# Patient Record
Sex: Female | Born: 1990 | Race: Black or African American | Hispanic: No | Marital: Single | Smoking: Never smoker
Health system: Southern US, Community
[De-identification: ages and names within clinical notes are randomized; demographics above are authoritative.]

## PROBLEM LIST (undated history)

## (undated) ENCOUNTER — Inpatient Hospital Stay (HOSPITAL_COMMUNITY): Payer: Self-pay

## (undated) HISTORY — PX: DILATION AND CURETTAGE OF UTERUS: SHX78

---

## 2015-01-01 ENCOUNTER — Encounter (HOSPITAL_COMMUNITY): Payer: Self-pay

## 2015-01-01 ENCOUNTER — Inpatient Hospital Stay (HOSPITAL_COMMUNITY)
Admission: AD | Admit: 2015-01-01 | Discharge: 2015-01-01 | Disposition: A | Discharge status: Home / Self Care | Payer: 59 | Source: Ambulatory Visit | Attending: Obstetrics & Gynecology | Admitting: Obstetrics & Gynecology

## 2015-01-01 DIAGNOSIS — M545 Low back pain: Secondary | ICD-10-CM | POA: Diagnosis present

## 2015-01-01 DIAGNOSIS — O9989 Other specified diseases and conditions complicating pregnancy, childbirth and the puerperium: Secondary | ICD-10-CM | POA: Insufficient documentation

## 2015-01-01 DIAGNOSIS — M549 Dorsalgia, unspecified: Secondary | ICD-10-CM

## 2015-01-01 DIAGNOSIS — Z3A16 16 weeks gestation of pregnancy: Secondary | ICD-10-CM | POA: Diagnosis not present

## 2015-01-01 DIAGNOSIS — O99891 Other specified diseases and conditions complicating pregnancy: Secondary | ICD-10-CM

## 2015-01-01 DIAGNOSIS — O26892 Other specified pregnancy related conditions, second trimester: Secondary | ICD-10-CM

## 2015-01-01 LAB — URINALYSIS, ROUTINE W REFLEX MICROSCOPIC
Bilirubin Urine: NEGATIVE
Glucose, UA: NEGATIVE mg/dL
Hgb urine dipstick: NEGATIVE
Ketones, ur: NEGATIVE mg/dL
Leukocytes, UA: NEGATIVE
Nitrite: NEGATIVE
Protein, ur: NEGATIVE mg/dL
Specific Gravity, Urine: 1.025 (ref 1.005–1.030)
Urobilinogen, UA: 0.2 mg/dL (ref 0.0–1.0)
pH: 7 (ref 5.0–8.0)

## 2015-01-01 NOTE — MAU Provider Note (Signed)
  History     CSN: [id number]  Arrival date and time: 01/01/15 1518   None     Chief Complaint  Patient presents with  . Back Pain   HPI This is a 24 y.o. female at 941w6d who presents with c/o low back pain and dysuria. Denies bleeding or contractions.   Has not had any care with this pregnancy yet   Has a 816 month old. States is afraid to tell her mother.  Went to [place] with that baby. 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 Left sided flank pain. No bleeding or abnormal vaginal discharge. LMP 09/05/2014. +HPT in December.           OB History    Gravida Para Term Preterm AB TAB SAB Ectopic Multiple Living   3 1  1 1 1    1       No past medical history on file.  No past surgical history on file.  No family history on file.  History  Substance Use Topics  . Smoking status: Not on file  . Smokeless tobacco: Not on file  . Alcohol Use: Not on file    Allergies: No Known Allergies  No prescriptions prior to admission    Review of Systems  Constitutional: Negative for fever, chills and malaise/fatigue.  Gastrointestinal: Negative for nausea, vomiting, abdominal pain, diarrhea and constipation.  Genitourinary: Positive for dysuria (yesterday).  Musculoskeletal: Positive for back pain.  Neurological: Negative for dizziness.   Physical Exam   Last menstrual period 09/05/2014.  Physical Exam  Constitutional: She is oriented to person, place, and time. She appears well-developed and well-nourished. No distress.  HENT:  Head: Normocephalic.  Cardiovascular: Normal rate.   Respiratory: Effort normal.  Genitourinary: Vagina normal. No vaginal discharge found.  Cervix long and closed Pt declines STD tests  Musculoskeletal: Normal range of motion.  Neurological: She is alert and oriented to person, place, and time.  Skin: Skin is warm and dry.  Psychiatric:  She has a normal mood and affect.   FHR 154  MAU Course  Procedures  MDM Results for orders placed or performed during the hospital encounter of 01/01/15 (from the past 24 hour(s))  Urinalysis, Routine w reflex microscopic     Status: None   Collection Time: 01/01/15  4:10 PM  Result Value Ref Range   Color, Urine YELLOW YELLOW   APPearance CLEAR CLEAR   Specific Gravity, Urine 1.025 1.005 - 1.030   pH 7.0 5.0 - 8.0   Glucose, UA NEGATIVE NEGATIVE mg/dL   Hgb urine dipstick NEGATIVE NEGATIVE   Bilirubin Urine NEGATIVE NEGATIVE   Ketones, ur NEGATIVE NEGATIVE mg/dL   Protein, ur NEGATIVE NEGATIVE mg/dL   Urobilinogen, UA 0.2 0.0 - 1.0 mg/dL   Nitrite NEGATIVE NEGATIVE   Leukocytes, UA NEGATIVE NEGATIVE     Assessment and Plan  A:  SIUP at 6441w6d        Low back pain  P:  Discussed back pain       Advised to stop lifting until back pain improves, then no more than 25  Lbs       Proof of pregnancy letter provided       [facility]  Lonn Im 01/01/2015, 4:12 PM

## 2015-01-01 NOTE — MAU Note (Signed)
Pt states pain with voiding yesterday. Left sided flank pain. No bleeding or abnormal vaginal discharge. LMP 09/05/2014. +HPT in December.

## 2015-01-01 NOTE — Discharge Instructions (Signed)
Back Injury Prevention Back injuries can be extremely painful and difficult to heal. After having one back injury, you are much more likely to experience another later on. It is important to learn how to avoid injuring or re-injuring your back. The following tips can help you to prevent a back injury. PHYSICAL FITNESS  Exercise regularly and try to develop good tone in your abdominal muscles. Your abdominal muscles provide a lot of the support needed by your back.  Do aerobic exercises (walking, jogging, biking, swimming) regularly.  Do exercises that increase balance and strength (tai chi, yoga) regularly. This can decrease your risk of falling and injuring your back.  Stretch before and after exercising.  Maintain a healthy weight. The more you weigh, the more stress is placed on your back. For every pound of weight, 10 times that amount of pressure is placed on the back. DIET  Talk to your caregiver about how much calcium and vitamin D you need per day. These nutrients help to prevent weakening of the bones (osteoporosis). Osteoporosis can cause broken (fractured) bones that lead to back pain.  Include good sources of calcium in your diet, such as dairy products, green, leafy vegetables, and products with calcium added (fortified).  Include good sources of vitamin D in your diet, such as milk and foods that are fortified with vitamin D.  Consider taking a nutritional supplement or a multivitamin if needed.  Stop smoking if you smoke. POSTURE  Sit and stand up straight. Avoid leaning forward when you sit or hunching over when you stand.  Choose chairs with good low back (lumbar) support.  If you work at a desk, sit close to your work so you do not need to lean over. Keep your chin tucked in. Keep your neck drawn back and elbows bent at a right angle. Your arms should look like the letter "L."  Sit high and close to the steering wheel when you drive. Add a lumbar support to your car  seat if needed.  Avoid sitting or standing in one position for too long. Take breaks to get up, stretch, and walk around at least once every hour. Take breaks if you are driving for long periods of time.  Sleep on your side with your knees slightly bent, or sleep on your back with a pillow under your knees. Do not sleep on your stomach. LIFTING, TWISTING, AND REACHING  Avoid heavy lifting, especially repetitive lifting. If you must do heavy lifting:  Stretch before lifting.  Work slowly.  Rest between lifts.  Use carts and dollies to move objects when possible.  Make several small trips instead of carrying 1 heavy load.  Ask for help when you need it.  Ask for help when moving big, awkward objects.  Follow these steps when lifting:  Stand with your feet shoulder-width apart.  Get as close to the object as you can. Do not try to pick up heavy objects that are far from your body.  Use handles or lifting straps if they are available.  Bend at your knees. Squat down, but keep your heels off the floor.  Keep your shoulders pulled back, your chin tucked in, and your back straight.  Lift the object slowly, tightening the muscles in your legs, abdomen, and buttocks. Keep the object as close to the center of your body as possible.  When you put a load down, use these same guidelines in reverse.  Do not:  Lift the object above your waist.  Twist at the waist while lifting or carrying a load. Move your feet if you need to turn, not your waist. °¨ Bend over without bending at your knees. °· Avoid reaching over your head, across a table, or for an object on a high surface. °OTHER TIPS °· Avoid wet floors and keep sidewalks clear of ice to prevent falls. °· Do not sleep on a mattress that is too soft or too hard. °· Keep items that are used frequently within easy reach. °· Put heavier objects on shelves at waist level and lighter objects on lower or higher shelves. °· Find ways to  decrease your stress, such as exercise, massage, or relaxation techniques. Stress can build up in your muscles. Tense muscles are more vulnerable to injury. °· Seek treatment for depression or anxiety if needed. These conditions can increase your risk of developing back pain. °SEEK MEDICAL CARE IF: °· You injure your back. °· You have questions about diet, exercise, or other ways to prevent back injuries. °MAKE SURE YOU: °· Understand these instructions. °· Will watch your condition. °· Will get help right away if you are not doing well or get worse. °Document Released: 11/28/2004 Document Revised: 01/13/2012 Document Reviewed: 12/02/2011 °ExitCare® Patient Information ©2015 [employer]. This information is not intended to replace advice given to you by your health care provider. Make sure you discuss any questions you have with your health care provider. ° °Second Trimester of Pregnancy °The second trimester is from week 13 through week 28, months 4 through 6. The second trimester is often a time when you feel your best. Your body has also adjusted to being pregnant, and you begin to feel better physically. Usually, morning sickness has lessened or quit completely, you may have more energy, and you may have an increase in appetite. The second trimester is also a time when the fetus is growing rapidly. At the end of the sixth month, the fetus is about 9 inches long and weighs about 1½ pounds. You will likely begin to feel the baby move (quickening) between 18 and 20 weeks of the pregnancy. °BODY CHANGES °Your body goes through many changes during pregnancy. The changes vary from woman to woman.  °· Your weight will continue to increase. You will notice your lower abdomen bulging out. °· You may begin to get stretch marks on your hips, abdomen, and breasts. °· You may develop headaches that can be relieved by medicines approved by your health care provider. °· You may urinate more often because the fetus is  pressing on your bladder. °· You may develop or continue to have heartburn as a result of your pregnancy. °· You may develop constipation because certain hormones are causing the muscles that push waste through your intestines to slow down. °· You may develop hemorrhoids or swollen, bulging veins (varicose veins). °· You may have back pain because of the weight gain and pregnancy hormones relaxing your joints between the bones in your pelvis and as a result of a shift in weight and the muscles that support your balance. °· Your breasts will continue to grow and be tender. °· Your gums may bleed and may be sensitive to brushing and flossing. °· Dark spots or blotches (chloasma, mask of pregnancy) may develop on your face. This will likely fade after the baby is born. °· A dark line from your belly button to the pubic area (linea nigra) may appear. This will likely fade after the baby is born. °· You may   have changes in your hair. These can include thickening of your hair, rapid growth, and changes in texture. Some women also have hair loss during or after pregnancy, or hair that feels dry or thin. Your hair will most likely return to normal after your baby is born. °WHAT TO EXPECT AT YOUR PRENATAL VISITS °During a routine prenatal visit: °· You will be weighed to make sure you and the fetus are growing normally. °· Your blood pressure will be taken. °· Your abdomen will be measured to track your baby's growth. °· The fetal heartbeat will be listened to. °· Any test results from the previous visit will be discussed. °Your health care provider may ask you: °· How you are feeling. °· If you are feeling the baby move. °· If you have had any abnormal symptoms, such as leaking fluid, bleeding, severe headaches, or abdominal cramping. °· If you have any questions. °Other tests that may be performed during your second trimester include: °· Blood tests that check for: °¨ Low iron levels (anemia). °¨ Gestational diabetes  (between 24 and 28 weeks). °¨ Rh antibodies. °· Urine tests to check for infections, diabetes, or protein in the urine. °· An ultrasound to confirm the proper growth and development of the baby. °· An amniocentesis to check for possible genetic problems. °· Fetal screens for spina bifida and Down syndrome. °HOME CARE INSTRUCTIONS  °· Avoid all smoking, herbs, alcohol, and unprescribed drugs. These chemicals affect the formation and growth of the baby. °· Follow your health care provider's instructions regarding medicine use. There are medicines that are either safe or unsafe to take during pregnancy. °· Exercise only as directed by your health care provider. Experiencing uterine cramps is a good sign to stop exercising. °· Continue to eat regular, healthy meals. °· Wear a good support bra for breast tenderness. °· Do not use hot tubs, steam rooms, or saunas. °· Wear your seat belt at all times when driving. °· Avoid raw meat, uncooked cheese, cat litter boxes, and soil used by cats. These carry germs that can cause birth defects in the baby. °· Take your prenatal vitamins. °· Try taking a stool softener (if your health care provider approves) if you develop constipation. Eat more high-fiber foods, such as fresh vegetables or fruit and whole grains. Drink plenty of fluids to keep your urine clear or pale yellow. °· Take warm sitz baths to soothe any pain or discomfort caused by hemorrhoids. Use hemorrhoid cream if your health care provider approves. °· If you develop varicose veins, wear support hose. Elevate your feet for 15 minutes, 3-4 times a day. Limit salt in your diet. °· Avoid heavy lifting, wear low heel shoes, and practice good posture. °· Rest with your legs elevated if you have leg cramps or low back pain. °· Visit your dentist if you have not gone yet during your pregnancy. Use a soft toothbrush to brush your teeth and be gentle when you floss. °· A sexual relationship may be continued unless your health  care provider directs you otherwise. °· Continue to go to all your prenatal visits as directed by your health care provider. °SEEK MEDICAL CARE IF:  °· You have dizziness. °· You have mild pelvic cramps, pelvic pressure, or nagging pain in the abdominal area. °· You have persistent nausea, vomiting, or diarrhea. °· You have a bad smelling vaginal discharge. °· You have pain with urination. °SEEK IMMEDIATE MEDICAL CARE IF:  °· You have a fever. °· You are   leaking fluid from your vagina. °· You have spotting or bleeding from your vagina. °· You have severe abdominal cramping or pain. °· You have rapid weight gain or loss. °· You have shortness of breath with chest pain. °· You notice sudden or extreme swelling of your face, hands, ankles, feet, or legs. °· You have not felt your baby move in over an hour. °· You have severe headaches that do not go away with medicine. °· You have vision changes. °Document Released: 10/15/2001 Document Revised: 10/26/2013 Document Reviewed: 12/22/2012 °ExitCare® Patient Information ©2015 [employer]. This information is not intended to replace advice given to you by your health care provider. Make sure you discuss any questions you have with your health care provider. ° °

## 2015-11-06 ENCOUNTER — Encounter (HOSPITAL_COMMUNITY): Payer: Self-pay | Admitting: *Deleted

## 2016-04-18 ENCOUNTER — Encounter (HOSPITAL_COMMUNITY): Payer: Self-pay | Admitting: Emergency Medicine

## 2016-04-18 ENCOUNTER — Ambulatory Visit (HOSPITAL_COMMUNITY)
Admission: EM | Admit: 2016-04-18 | Discharge: 2016-04-18 | Disposition: A | Discharge status: Home / Self Care | Payer: 59 | Attending: Emergency Medicine | Admitting: Emergency Medicine

## 2016-04-18 DIAGNOSIS — G44209 Tension-type headache, unspecified, not intractable: Secondary | ICD-10-CM

## 2016-04-18 MED ORDER — DEXAMETHASONE SODIUM PHOSPHATE 10 MG/ML IJ SOLN
10.0000 mg | Freq: Once | INTRAMUSCULAR | Status: AC
  Administered 2016-04-18: 10 mg via INTRAMUSCULAR

## 2016-04-18 MED ORDER — KETOROLAC TROMETHAMINE 60 MG/2ML IM SOLN
60.0000 mg | Freq: Once | INTRAMUSCULAR | Status: AC
  Administered 2016-04-18: 60 mg via INTRAMUSCULAR

## 2016-04-18 MED ORDER — KETOROLAC TROMETHAMINE 60 MG/2ML IM SOLN
INTRAMUSCULAR | Status: AC
  Filled 2016-04-18: qty 2

## 2016-04-18 MED ORDER — DEXAMETHASONE SODIUM PHOSPHATE 10 MG/ML IJ SOLN
INTRAMUSCULAR | Status: AC
Start: 2016-04-18 — End: 2016-04-18
  Filled 2016-04-18: qty 1

## 2016-04-18 NOTE — ED Provider Notes (Signed)
CSN: [id number]     Arrival date & time 04/18/16  1900 History   First MD Initiated Contact with Patient 04/18/16 2033     Chief Complaint  Patient presents with  . Headache   (Consider location/radiation/quality/duration/timing/severity/associated sxs/prior Treatment) HPI  She is a 25 year old woman here for evaluation of headache. Headache has been persistent for the last 4-5 days. She states initially involved her whole head, but now mostly is on the left side. It has been constant. She is able to sleep, but it is present on awakening. She has tried Tylenol, ibuprofen, Advil, and Aleve without improvement. Nothing makes the headache worse except for her children crying. She denies any photophobia or phonophobia. No nausea. She reports a little bit of blurred vision, but only when she has been staring at a screen for some time. No focal numbness, tingling, weakness. No difficulties with speech or swallowing. She has had one migraine headache in the past, but this feels different.  Past Medical History  Diagnosis Date  . Preterm labor    Past Surgical History  Procedure Laterality Date  . Dilation and curettage of uterus     No family history on file. Social History  Substance Use Topics  . Smoking status: Never Smoker   . Smokeless tobacco: Never Used  . Alcohol Use: No   OB History    Gravida Para Term Preterm AB TAB SAB Ectopic Multiple Living   3 1  1 1 1    1      Review of Systems As in history of present illness Allergies  Review of patient's allergies indicates no known allergies.  Home Medications   Prior to Admission medications   Not on File   Meds Ordered and Administered this Visit   Medications  ketorolac (TORADOL) injection 60 mg (60 mg Intramuscular Given 04/18/16 2112)  dexamethasone (DECADRON) injection 10 mg (10 mg Intramuscular Given 04/18/16 2112)    BP 126/75 mmHg  Pulse 74  Temp(Src) 99 F (37.2 C) (Oral)  Resp 12  SpO2 100%  LMP  04/18/2016 No data found.   Physical Exam  Constitutional: She is oriented to person, place, and time. She appears well-developed and well-nourished. No distress.  HENT:  Mouth/Throat: Oropharynx is clear and moist.  Eyes: EOM are normal. Pupils are equal, round, and reactive to light.  Neck: Neck supple.  Cardiovascular: Normal rate, regular rhythm and normal heart sounds.   No murmur heard. Pulmonary/Chest: Effort normal and breath sounds normal. No respiratory distress. She has no wheezes. She has no rales.  Neurological: She is alert and oriented to person, place, and time. No cranial nerve deficit. She exhibits normal muscle tone. Coordination normal.    ED Course  Procedures (including critical care time)  Labs Review Labs Reviewed - No data to display  Imaging Review No results found.   MDM   1. Tension-type headache, not intractable, unspecified chronicity pattern    History is most consistent with a tension-type headache. Neurologic exam is normal. We'll treat here with Toradol and Decadron. Return precautions reviewed.    Charm RingsErin J Honig, MD 04/18/16 2116

## 2016-04-18 NOTE — Discharge Instructions (Signed)
We gave you a couple medicines to help with the headache. The headache is most consistent with a tension type headache. There is no sign of intracranial process going on. Your headache will hopefully be much improved or gone tomorrow morning when you wake up. Follow-up as needed.

## 2016-04-18 NOTE — ED Notes (Signed)
PT reports a generalized headache started 4 days ago. Headache has migrated to left side of head. PT reports intermittent blurry vision in left eye. PT reports history of migraines but states, "They aren't like this. This is different." PT has used ibuprofen and tylenol with no relief at all.

## 2018-07-07 ENCOUNTER — Inpatient Hospital Stay (HOSPITAL_COMMUNITY)
Admission: AD | Admit: 2018-07-07 | Discharge: 2018-07-08 | Disposition: A | Discharge status: Home / Self Care | Payer: BLUE CROSS/BLUE SHIELD | Source: Ambulatory Visit | Attending: Obstetrics and Gynecology | Admitting: Obstetrics and Gynecology

## 2018-07-07 ENCOUNTER — Inpatient Hospital Stay (HOSPITAL_BASED_OUTPATIENT_CLINIC_OR_DEPARTMENT_OTHER): Payer: BLUE CROSS/BLUE SHIELD

## 2018-07-07 ENCOUNTER — Encounter: Payer: Self-pay | Admitting: Student

## 2018-07-07 DIAGNOSIS — Z3686 Encounter for antenatal screening for cervical length: Secondary | ICD-10-CM | POA: Diagnosis not present

## 2018-07-07 DIAGNOSIS — R109 Unspecified abdominal pain: Secondary | ICD-10-CM

## 2018-07-07 DIAGNOSIS — O26892 Other specified pregnancy related conditions, second trimester: Secondary | ICD-10-CM | POA: Diagnosis present

## 2018-07-07 DIAGNOSIS — O09212 Supervision of pregnancy with history of pre-term labor, second trimester: Secondary | ICD-10-CM | POA: Insufficient documentation

## 2018-07-07 DIAGNOSIS — O2342 Unspecified infection of urinary tract in pregnancy, second trimester: Secondary | ICD-10-CM | POA: Diagnosis not present

## 2018-07-07 DIAGNOSIS — O9989 Other specified diseases and conditions complicating pregnancy, childbirth and the puerperium: Secondary | ICD-10-CM | POA: Diagnosis not present

## 2018-07-07 DIAGNOSIS — Z3A25 25 weeks gestation of pregnancy: Secondary | ICD-10-CM

## 2018-07-07 DIAGNOSIS — O09892 Supervision of other high risk pregnancies, second trimester: Secondary | ICD-10-CM

## 2018-07-07 DIAGNOSIS — O26899 Other specified pregnancy related conditions, unspecified trimester: Secondary | ICD-10-CM

## 2018-07-07 LAB — URINALYSIS, ROUTINE W REFLEX MICROSCOPIC
Bilirubin Urine: NEGATIVE
GLUCOSE, UA: NEGATIVE mg/dL
Hgb urine dipstick: NEGATIVE
KETONES UR: NEGATIVE mg/dL
Nitrite: NEGATIVE
PH: 6 (ref 5.0–8.0)
Protein, ur: NEGATIVE mg/dL
Specific Gravity, Urine: 1.013 (ref 1.005–1.030)

## 2018-07-07 LAB — WET PREP, GENITAL
Clue Cells Wet Prep HPF POC: NONE SEEN
Sperm: NONE SEEN
Trich, Wet Prep: NONE SEEN
Yeast Wet Prep HPF POC: NONE SEEN

## 2018-07-07 NOTE — MAU Note (Signed)
Pt reports pelvic pressure, abdominal cramping and spotting that started this morning. States she has a history of 2 preterm deliveries. Was on Makena but had an allergic reaction so no longer taking. Pt gets PNC in [place]. Reports good fetal movement. Pt denies LOF.

## 2018-07-07 NOTE — MAU Provider Note (Signed)
Chief Complaint:  Abdominal Pain   First Provider Initiated Contact with Patient 07/07/18 2303     HPI: Selena Burch is a 27 y.o. [id number] at [redacted]w[redacted]d who presents to maternity admissions reporting abdominal pain. Symptoms began this afternoon. Had spotting earlier today. Bleeding has not continued. No LOF. Increase in urinary frequency & pelvic pressure but denies hematuria or dysuria. No recent intercourse. Gets prenatal care in [place], but was visiting her mother today in [place] when symptoms started. Hx of PTD x 2, earliest at 35 wks. Discontinued 17-P d/t allergic reaction.   Location: abdomen Quality: cramping Severity: 7/10 in pain scale Duration: 4 hours Timing: every 3 minutes Modifying factors: none Associated signs and symptoms: vaginal spotting & urinary frequency   Past Medical History:  Diagnosis Date  . Preterm labor    OB History  Gravida Para Term Preterm AB Living  4 2   2 1 2   SAB TAB Ectopic Multiple Live Births    1     2    # Outcome Date GA Lbr Len/2nd Weight Sex Delivery Anes PTL Lv  4 Current           3 Preterm 05/2015 [redacted]w[redacted]d    Vag-Spont   LIV  2 Preterm 06/2014 [redacted]w[redacted]d    Vag-Spont   LIV  1 TAB            Past Surgical History:  Procedure Laterality Date  . DILATION AND CURETTAGE OF UTERUS     History reviewed. No pertinent family history. Social History   Tobacco Use  . Smoking status: Never Smoker  . Smokeless tobacco: Never Used  Substance Use Topics  . Alcohol use: No  . Drug use: No   Allergies  Allergen Reactions  . Makena [Hydroxyprogesterone Caproate]    Medications Prior to Admission  Medication Sig Dispense Refill Last Dose  . Prenatal Vit-Fe Fumarate-FA (MULTIVITAMIN-PRENATAL) 27-0.8 MG TABS tablet Take 1 tablet by mouth daily at 12 noon.   07/07/2018 at Unknown time    I have reviewed patient's Past Medical Hx, Surgical Hx, Family Hx, Social Hx, medications and allergies.   ROS:  Review of Systems   Constitutional: Negative.   Gastrointestinal: Positive for abdominal pain. Negative for constipation, diarrhea, nausea and vomiting.  Genitourinary: Positive for frequency and vaginal bleeding. Negative for dysuria and vaginal discharge.    Physical Exam   Patient Vitals for the past 24 hrs:  BP Temp Temp src Pulse Resp SpO2 Height Weight  07/07/18 2221 116/66 98 F (36.7 C) Oral 79 18 100 % 5\' 6"  (1.676 m) 105.7 kg    Constitutional: Well-developed, well-nourished female in no acute distress.  Cardiovascular: normal rate & rhythm, no murmur Respiratory: normal effort, lung sounds clear throughout GI: Abd soft, non-tender, gravid appropriate for gestational age. Pos BS x 4 MS: Extremities nontender, no edema, normal ROM Neurologic: Alert and oriented x 4.  GU:      Pelvic: NEFG, physiologic discharge, no blood, cervix clean.   Dilation: Closed Exam by:: Raiford Noble, NP  NST:  Baseline: 150 bpm, Variability: Good {> 6 bpm), Accelerations: Non-reactive but appropriate for gestational age, Decelerations: Absent and no contractions   Labs: Results for orders placed or performed during the hospital encounter of 07/07/18 (from the past 24 hour(s))  Urinalysis, Routine w reflex microscopic     Status: Abnormal   Collection Time: 07/07/18 11:08 PM  Result Value Ref Range   Color, Urine YELLOW YELLOW   APPearance CLEAR  CLEAR   Specific Gravity, Urine 1.013 1.005 - 1.030   pH 6.0 5.0 - 8.0   Glucose, UA NEGATIVE NEGATIVE mg/dL   Hgb urine dipstick NEGATIVE NEGATIVE   Bilirubin Urine NEGATIVE NEGATIVE   Ketones, ur NEGATIVE NEGATIVE mg/dL   Protein, ur NEGATIVE NEGATIVE mg/dL   Nitrite NEGATIVE NEGATIVE   Leukocytes, UA LARGE (A) NEGATIVE   RBC / HPF 0-5 0 - 5 RBC/hpf   WBC, UA 6-10 0 - 5 WBC/hpf   Bacteria, UA RARE (A) NONE SEEN   Squamous Epithelial / LPF 6-10 0 - 5   Mucus PRESENT   Wet prep, genital     Status: Abnormal   Collection Time: 07/07/18 11:25 PM  Result Value  Ref Range   Yeast Wet Prep HPF POC NONE SEEN NONE SEEN   Trich, Wet Prep NONE SEEN NONE SEEN   Clue Cells Wet Prep HPF POC NONE SEEN NONE SEEN   WBC, Wet Prep HPF POC MANY (A) NONE SEEN   Sperm NONE SEEN     Imaging:  No results found.  MAU Course: Orders Placed This Encounter  Procedures  . Wet prep, genital  . Culture, OB Urine  . [place] MFM OB Transvaginal  . Urinalysis, Routine w reflex microscopic  . Discharge patient   Meds ordered this encounter  Medications  . nitrofurantoin, macrocrystal-monohydrate, (MACROBID) 100 MG capsule    Sig: Take 1 capsule (100 mg total) by mouth 2 (two) times daily.    Dispense:  14 capsule    Refill:  0    Order Specific Question:   Supervising Provider    Answer:   CONSTANT, PEGGY [[id number]]    MDM: Fetal tracing appropriate for gestational age. No ctx on monitor nor palpated. Cervix closed. TVUS for CL which is 3.4 New onset urinary frequency & abdominal cramping; u/a + moderate leuks & bacteria. Urine culture pending. Will tx with abx.   Assessment: 1. Abdominal cramping affecting pregnancy   2. [redacted] weeks gestation of pregnancy   3. History of preterm delivery, currently pregnant in second trimester   4. Urinary tract infection in mother during second trimester of pregnancy     Plan: Discharge home in stable condition.  Preterm Labor precautions and fetal kick counts   Allergies as of 07/08/2018      Reactions   Makena [hydroxyprogesterone Caproate]       Medication List    TAKE these medications   multivitamin-prenatal 27-0.8 MG Tabs tablet Take 1 tablet by mouth daily at 12 noon.   nitrofurantoin (macrocrystal-monohydrate) 100 MG capsule Commonly known as:  MACROBID Take 1 capsule (100 mg total) by mouth 2 (two) times daily.       Judeth Horn, NP 07/08/2018 12:36 AM

## 2018-07-08 MED ORDER — NITROFURANTOIN MONOHYD MACRO 100 MG PO CAPS: 100.0000 mg | ORAL_CAPSULE | Freq: Two times a day (BID) | ORAL | 0 refills | Status: DC

## 2018-07-08 NOTE — Discharge Instructions (Signed)
Preterm Labor and Birth Information The normal length of a pregnancy is 39-41 weeks. Preterm labor is when labor starts before 37 completed weeks of pregnancy. What are the risk factors for preterm labor? Preterm labor is more likely to occur in women who:  Have certain infections during pregnancy such as a bladder infection, sexually transmitted infection, or infection inside the uterus (chorioamnionitis).  Have a shorter-than-normal cervix.  Have gone into preterm labor before.  Have had surgery on their cervix.  Are younger than age 89 or older than age 19.  Are African American.  Are pregnant with twins or multiple babies (multiple gestation).  Take street drugs or smoke while pregnant.  Do not gain enough weight while pregnant.  Became pregnant shortly after having been pregnant.  What are the symptoms of preterm labor? Symptoms of preterm labor include:  Cramps similar to those that can happen during a menstrual period. The cramps may happen with diarrhea.  Pain in the abdomen or lower back.  Regular uterine contractions that may feel like tightening of the abdomen.  A feeling of increased pressure in the pelvis.  Increased watery or bloody mucus discharge from the vagina.  Water breaking (ruptured amniotic sac).  Why is it important to recognize signs of preterm labor? It is important to recognize signs of preterm labor because babies who are born prematurely may not be fully developed. This can put them at an increased risk for:  Long-term (chronic) heart and lung problems.  Difficulty immediately after birth with regulating body systems, including blood sugar, body temperature, heart rate, and breathing rate.  Bleeding in the brain.  Cerebral palsy.  Learning difficulties.  Death.  These risks are highest for babies who are born before 37 weeks of pregnancy. How is preterm labor treated? Treatment depends on the length of your pregnancy, your  condition, and the health of your baby. It may involve:  Having a stitch (suture) placed in your cervix to prevent your cervix from opening too early (cerclage).  Taking or being given medicines, such as: ? Hormone medicines. These may be given early in pregnancy to help support the pregnancy. ? Medicine to stop contractions. ? Medicines to help mature the babys lungs. These may be prescribed if the risk of delivery is high. ? Medicines to prevent your baby from developing cerebral palsy.  If the labor happens before 34 weeks of pregnancy, you may need to stay in the hospital. What should I do if I think I am in preterm labor? If you think that you are going into preterm labor, call your health care provider right away. How can I prevent preterm labor in future pregnancies? To increase your chance of having a full-term pregnancy:  Do not use any tobacco products, such as cigarettes, chewing tobacco, and e-cigarettes. If you need help quitting, ask your health care provider.  Do not use street drugs or medicines that have not been prescribed to you during your pregnancy.  Talk with your health care provider before taking any herbal supplements, even if you have been taking them regularly.  Make sure you gain a healthy amount of weight during your pregnancy.  Watch for infection. If you think that you might have an infection, get it checked right away.  Make sure to tell your health care provider if you have gone into preterm labor before.  This information is not intended to replace advice given to you by your health care provider. Make sure you discuss any questions  you have with your health care provider. °Document Released: 01/11/2004 Document Revised: 04/02/2016 Document Reviewed: 03/13/2016 °Elsevier Interactive Patient Education © 2018 [employer] °Pregnancy and Urinary Tract Infection °What is a urinary tract infection? °A urinary tract infection (UTI) is an infection of any part  of the urinary tract. This includes the kidneys, the tubes that connect your kidneys to your bladder (ureters), the bladder, and the tube that carries urine out of your body (urethra). These organs make, store, and get rid of urine in the body. A UTI can be a bladder infection (cystitis) or a kidney infection (pyelonephritis). This infection may be caused by fungi, viruses, and bacteria. Bacteria are the most common cause of UTIs. °You are more likely to develop a UTI during pregnancy because: °· The physical and hormonal changes your body goes through can make it easier for bacteria to get into your urinary tract. °· Your growing baby puts pressure on your uterus and can affect urine flow. ° °Does a UTI place my baby at risk? °An untreated UTI during pregnancy could lead to a kidney infection, which can cause health problems that could affect your baby. Possible complications of an untreated UTI include: °· Having your baby before 37 weeks of pregnancy (premature). °· Having a baby with a low birth weight. °· Developing high blood pressure during pregnancy (preeclampsia). ° °What are the symptoms of a UTI? °Symptoms of a UTI include: °· Fever. °· Frequent urination or passing small amounts of urine frequently. °· Needing to urinate urgently. °· Pain or a burning sensation with urination. °· Urine that smells bad or unusual. °· Cloudy urine. °· Pain in the lower abdomen or back. °· Trouble urinating. °· Blood in the urine. °· Vomiting or being less hungry than normal. °· Diarrhea or abdominal pain. °· Vaginal discharge. ° °What are the treatment options for a UTI during pregnancy? °Treatment for this condition may include: °· Antibiotic medicines that are safe to take during pregnancy. °· Other medicines to treat less common causes of UTI. ° °How can I prevent a UTI? ° °To prevent a UTI: °· Go to the bathroom as soon as you feel the need. °· Always wipe from front to back. °· Wash your genital area with soap and  warm water daily. °· Empty your bladder before and after sex. °· Wear cotton underwear. °· Limit your intake of high sugar foods or drinks, such as regular soda, juice, and sweets. °· Drink 6-8 glasses of water daily. °· Do not wear tight-fitting pants. °· Do not douche or use deodorant sprays. °· Do not drink alcohol, caffeine, or carbonated drinks. These can irritate the bladder. ° °Contact a health care provider if: °· Your symptoms do not improve or get worse. °· You have a fever after two days of treatment. °· You have a rash. °· You have abnormal vaginal discharge. °· You have back or side pain. °· You have chills. °· You have nausea and vomiting. °Get help right away if: °Seek immediate medical care if you are pregnant and: °· You feel contractions in your uterus. °· You have lower belly pain. °· You have a gush of fluid from your vagina. °· You have blood in your urine. °· You are vomiting and cannot keep down any medicines or water. ° °This information is not intended to replace advice given to you by your health care provider. Make sure you discuss any questions you have with your health care provider. °Document Released: 02/15/2011 Document   Revised: 10/04/2016 Document Reviewed: 09/11/2015 Elsevier Interactive Patient Education  2017 [employer].

## 2018-07-09 LAB — CULTURE, OB URINE

## 2018-07-09 LAB — GC/CHLAMYDIA PROBE AMP (~~LOC~~) NOT AT ARMC
Chlamydia: NEGATIVE
Neisseria Gonorrhea: NEGATIVE

## 2018-08-07 ENCOUNTER — Encounter (HOSPITAL_COMMUNITY): Payer: Self-pay | Admitting: *Deleted

## 2018-08-07 ENCOUNTER — Inpatient Hospital Stay (HOSPITAL_COMMUNITY)
Admission: AD | Admit: 2018-08-07 | Discharge: 2018-08-07 | Disposition: A | Discharge status: Home / Self Care | Payer: BLUE CROSS/BLUE SHIELD | Source: Ambulatory Visit | Attending: Obstetrics & Gynecology | Admitting: Obstetrics & Gynecology

## 2018-08-07 DIAGNOSIS — O26893 Other specified pregnancy related conditions, third trimester: Secondary | ICD-10-CM | POA: Insufficient documentation

## 2018-08-07 DIAGNOSIS — O212 Late vomiting of pregnancy: Secondary | ICD-10-CM | POA: Diagnosis not present

## 2018-08-07 DIAGNOSIS — R42 Dizziness and giddiness: Secondary | ICD-10-CM

## 2018-08-07 DIAGNOSIS — O219 Vomiting of pregnancy, unspecified: Secondary | ICD-10-CM

## 2018-08-07 DIAGNOSIS — Z3A29 29 weeks gestation of pregnancy: Secondary | ICD-10-CM | POA: Insufficient documentation

## 2018-08-07 LAB — CBC
HCT: 31 % — ABNORMAL LOW (ref 36.0–46.0)
Hemoglobin: 10.5 g/dL — ABNORMAL LOW (ref 12.0–15.0)
MCH: 28.2 pg (ref 26.0–34.0)
MCHC: 33.9 g/dL (ref 30.0–36.0)
MCV: 83.1 fL (ref 78.0–100.0)
PLATELETS: 323 10*3/uL (ref 150–400)
RBC: 3.73 MIL/uL — ABNORMAL LOW (ref 3.87–5.11)
RDW: 13.8 % (ref 11.5–15.5)
WBC: 14.5 10*3/uL — ABNORMAL HIGH (ref 4.0–10.5)

## 2018-08-07 LAB — URINALYSIS, ROUTINE W REFLEX MICROSCOPIC
BILIRUBIN URINE: NEGATIVE
GLUCOSE, UA: NEGATIVE mg/dL
Hgb urine dipstick: NEGATIVE
Ketones, ur: 20 mg/dL — AB
Nitrite: NEGATIVE
Protein, ur: NEGATIVE mg/dL
Specific Gravity, Urine: 1.014 (ref 1.005–1.030)
pH: 6 (ref 5.0–8.0)

## 2018-08-07 LAB — COMPREHENSIVE METABOLIC PANEL
ALT: 10 U/L (ref 0–44)
AST: 18 U/L (ref 15–41)
Albumin: 3 g/dL — ABNORMAL LOW (ref 3.5–5.0)
Alkaline Phosphatase: 71 U/L (ref 38–126)
Anion gap: 8 (ref 5–15)
BUN: <5 mg/dL — ABNORMAL LOW (ref 6–20)
CHLORIDE: 109 mmol/L (ref 98–111)
CO2: 18 mmol/L — ABNORMAL LOW (ref 22–32)
Calcium: 8.7 mg/dL — ABNORMAL LOW (ref 8.9–10.3)
Creatinine, Ser: 0.67 mg/dL (ref 0.44–1.00)
GFR calc non Af Amer: >60 mL/min (ref >60)
Glucose, Bld: 110 mg/dL — ABNORMAL HIGH (ref 70–99)
POTASSIUM: 3.9 mmol/L (ref 3.5–5.1)
SODIUM: 135 mmol/L (ref 135–145)
Total Bilirubin: 0.3 mg/dL (ref 0.3–1.2)
Total Protein: 6.6 g/dL (ref 6.5–8.1)

## 2018-08-07 MED ORDER — DOCUSATE SODIUM 100 MG PO CAPS: 100.0000 mg | ORAL_CAPSULE | Freq: Two times a day (BID) | ORAL | 2 refills | Status: AC | PRN

## 2018-08-07 MED ORDER — METOCLOPRAMIDE HCL 5 MG/ML IJ SOLN
10.0000 mg | Freq: Once | INTRAMUSCULAR | Status: AC
  Administered 2018-08-07: 10 mg via INTRAVENOUS
  Filled 2018-08-07: qty 2

## 2018-08-07 MED ORDER — DEXAMETHASONE SODIUM PHOSPHATE 10 MG/ML IJ SOLN
10.0000 mg | Freq: Once | INTRAMUSCULAR | Status: AC
  Administered 2018-08-07: 10 mg via INTRAVENOUS
  Filled 2018-08-07: qty 1

## 2018-08-07 MED ORDER — DIPHENHYDRAMINE HCL 50 MG/ML IJ SOLN
25.0000 mg | Freq: Once | INTRAMUSCULAR | Status: AC
  Administered 2018-08-07: 25 mg via INTRAVENOUS
  Filled 2018-08-07: qty 1

## 2018-08-07 MED ORDER — METOCLOPRAMIDE HCL 10 MG PO TABS: 10.0000 mg | ORAL_TABLET | Freq: Three times a day (TID) | ORAL | 2 refills | Status: AC

## 2018-08-07 MED ORDER — LACTATED RINGERS IV BOLUS
1000.0000 mL | Freq: Once | INTRAVENOUS | Status: AC
  Administered 2018-08-07: 1000 mL via INTRAVENOUS

## 2018-08-07 MED ORDER — FERROUS SULFATE 325 (65 FE) MG PO TABS: 325.0000 mg | ORAL_TABLET | Freq: Every day | ORAL | 1 refills | Status: AC

## 2018-08-07 NOTE — MAU Provider Note (Signed)
Chief Complaint:  Dizziness and Nausea   First Provider Initiated Contact with Patient 08/07/18 1721      HPI: Selena Burch is a 27 y.o. [id number] at [redacted]w[redacted]d who presents to maternity admissions reporting onset of dizziness and n/v early this morning.  She reports waking up around 1 am with a sensation that the room was spinning. She went to the bathroom and went back to bed. She woke up again at 4 am with worse dizziness and nausea, she vomited x 1. By arrival to MAU she reports onset of headache with light sensitivity.  She denies any n/v or dizziness in the pregnancy prior to this morning.  There are no other associated symptoms. She is drinking fluids but reports not eating much today because of her symptoms. She came to MAU tonight when someone could bring her as she did not feel like she could drive with her symptoms. She has allergies and usually takes Claritin but has missed her dose the last 3 days. She is feeling some drainage from her right ear but no pain.  She has hx of migraines but has never had this spinning sensation or n/v before. She reports good fetal movement, denies abdominal cramping, LOF, vaginal bleeding, vaginal itching/burning, urinary symptoms, or fever/chills.    HPI  Past Medical History: Past Medical History:  Diagnosis Date  . Preterm labor     Past obstetric history: OB History  Gravida Para Term Preterm AB Living  4 2   2 1 2   SAB TAB Ectopic Multiple Live Births    1     2    # Outcome Date GA Lbr Len/2nd Weight Sex Delivery Anes PTL Lv  4 Current           3 Preterm 05/2015 [redacted]w[redacted]d    Vag-Spont   LIV  2 Preterm 06/2014 [redacted]w[redacted]d    Vag-Spont   LIV  1 TAB             Past Surgical History: Past Surgical History:  Procedure Laterality Date  . DILATION AND CURETTAGE OF UTERUS      Family History: History reviewed. No pertinent family history.  Social History: Social History   Tobacco Use  . Smoking status: Never Smoker  . Smokeless tobacco:  Never Used  Substance Use Topics  . Alcohol use: No  . Drug use: No    Allergies:  Allergies  Allergen Reactions  . Hydroxyprogesterone Caproate Swelling    Pain and redness in extremity of injection    Meds:  Medications Prior to Admission  Medication Sig Dispense Refill Last Dose  . loratadine (CLARITIN) 10 MG tablet Take 10 mg by mouth daily as needed for allergies.   Past Month at Unknown time  . Prenatal Vit-Fe Fumarate-FA (MULTIVITAMIN-PRENATAL) 27-0.8 MG TABS tablet Take 1 tablet by mouth daily at 12 noon.   Past Month at Unknown time  . nitrofurantoin, macrocrystal-monohydrate, (MACROBID) 100 MG capsule Take 1 capsule (100 mg total) by mouth 2 (two) times daily. (Patient not taking: Reported on 08/07/2018) 14 capsule 0 Not Taking at Unknown time    ROS:  Review of Systems  Constitutional: Negative for chills, fatigue and fever.  Eyes: Negative for visual disturbance.  Respiratory: Negative for shortness of breath.   Cardiovascular: Negative for chest pain.  Gastrointestinal: Positive for nausea and vomiting. Negative for abdominal pain.  Genitourinary: Negative for difficulty urinating, dysuria, flank pain, pelvic pain, vaginal bleeding, vaginal discharge and vaginal pain.  Neurological: Positive for  dizziness and headaches.  Psychiatric/Behavioral: Negative.      I have reviewed patient's Past Medical Hx, Surgical Hx, Family Hx, Social Hx, medications and allergies.   Physical Exam   Patient Vitals for the past 24 hrs:  BP Temp Temp src Pulse Resp Height Weight  08/07/18 2043 120/63 - - (!) 196 - - -  08/07/18 1547 113/66 98.8 F (37.1 C) Oral 90 18 5\' 6"  (1.676 m) 105.7 kg   Constitutional: Well-developed, well-nourished female in no acute distress.  Cardiovascular: normal rate Respiratory: normal effort GI: Abd soft, non-tender, gravid appropriate for gestational age.  MS: Extremities nontender, no edema, normal ROM Neurologic: Alert and oriented x 4.  GU:  Neg CVAT.  PELVIC EXAM: Cervix pink, visually closed, without lesion, scant white creamy discharge, vaginal walls and external genitalia normal Bimanual exam: Cervix 0/long/high, firm, anterior, neg CMT, uterus nontender, nonenlarged, adnexa without tenderness, enlargement, or mass     FHT:  Baseline 145 , moderate variability, accelerations present, no decelerations Contractions: None on toco or to palpation   Labs: Results for orders placed or performed during the hospital encounter of 08/07/18 (from the past 24 hour(s))  Urinalysis, Routine w reflex microscopic     Status: Abnormal   Collection Time: 08/07/18  5:06 PM  Result Value Ref Range   Color, Urine YELLOW YELLOW   APPearance HAZY (A) CLEAR   Specific Gravity, Urine 1.014 1.005 - 1.030   pH 6.0 5.0 - 8.0   Glucose, UA NEGATIVE NEGATIVE mg/dL   Hgb urine dipstick NEGATIVE NEGATIVE   Bilirubin Urine NEGATIVE NEGATIVE   Ketones, ur 20 (A) NEGATIVE mg/dL   Protein, ur NEGATIVE NEGATIVE mg/dL   Nitrite NEGATIVE NEGATIVE   Leukocytes, UA LARGE (A) NEGATIVE   RBC / HPF 0-5 0 - 5 RBC/hpf   WBC, UA 11-20 0 - 5 WBC/hpf   Bacteria, UA RARE (A) NONE SEEN   Squamous Epithelial / LPF 11-20 0 - 5   Mucus PRESENT   CBC     Status: Abnormal   Collection Time: 08/07/18  5:55 PM  Result Value Ref Range   WBC 14.5 (H) 4.0 - 10.5 K/uL   RBC 3.73 (L) 3.87 - 5.11 MIL/uL   Hemoglobin 10.5 (L) 12.0 - 15.0 g/dL   HCT 16.1 (L) 09.6 - 04.5 %   MCV 83.1 78.0 - 100.0 fL   MCH 28.2 26.0 - 34.0 pg   MCHC 33.9 30.0 - 36.0 g/dL   RDW 40.9 81.1 - 91.4 %   Platelets 323 150 - 400 K/uL  Comprehensive metabolic panel     Status: Abnormal   Collection Time: 08/07/18  5:55 PM  Result Value Ref Range   Sodium 135 135 - 145 mmol/L   Potassium 3.9 3.5 - 5.1 mmol/L   Chloride 109 98 - 111 mmol/L   CO2 18 (L) 22 - 32 mmol/L   Glucose, Bld 110 (H) 70 - 99 mg/dL   BUN <5 (L) 6 - 20 mg/dL   Creatinine, Ser 7.82 0.44 - 1.00 mg/dL   Calcium 8.7 (L) 8.9  - 10.3 mg/dL   Total Protein 6.6 6.5 - 8.1 g/dL   Albumin 3.0 (L) 3.5 - 5.0 g/dL   AST 18 15 - 41 U/L   ALT 10 0 - 44 U/L   Alkaline Phosphatase 71 38 - 126 U/L   Total Bilirubin 0.3 0.3 - 1.2 mg/dL   GFR calc non Af Amer >60 >60 mL/min   GFR calc Af  Amer >60 >60 mL/min   Anion gap 8 5 - 15      Imaging:  No results found.  MAU Course/MDM: I have ordered labs and reviewed results.  CBC, CMP, UA NST reviewed and reactive Headache cocktail with Reglan 10 mg IV, Benadryl 25 mg IV, Decadron 10 mg IV and LR 1000 ml Pt with complete resolution of symptoms after IV fluids/medications Hgb 10.5, which is down from pt early pregnancy Hgb of 12.7 (CareEverywhere) Possible causes of pt symptoms today are anemia and vertigo, likely from from fluid/drainage in ears due to allergies Will discharge home with Rx for Ferrous sulfate and Colace, and Reglan for nausea. Pt to resume Claritin at home. May add Dramamine or Benadryl PRN for dizziness.   F/U with your OB office Return to MAU as needed for emergencies Pt discharge with strict return precautions.    Assessment: 1. Dizziness   2. Vertigo, intermittent   3. Nausea and vomiting during pregnancy     Plan: Discharge home Labor precautions and fetal kick counts Follow-up Information    Your OB/Gyn office in [place] Follow up.   Why:  As scheduled, return to MAU as needed for emergencies.         Allergies as of 08/07/2018      Reactions   Hydroxyprogesterone Caproate Swelling   Pain and redness in extremity of injection      Medication List    STOP taking these medications   nitrofurantoin (macrocrystal-monohydrate) 100 MG capsule Commonly known as:  MACROBID     TAKE these medications   docusate sodium 100 MG capsule Commonly known as:  COLACE Take 1 capsule (100 mg total) by mouth 2 (two) times daily as needed.   ferrous sulfate 325 (65 FE) MG tablet Take 1 tablet (325 mg total) by mouth daily with  breakfast.   loratadine 10 MG tablet Commonly known as:  CLARITIN Take 10 mg by mouth daily as needed for allergies.   metoCLOPramide 10 MG tablet Commonly known as:  REGLAN Take 1 tablet (10 mg total) by mouth 3 (three) times daily before meals.   multivitamin-prenatal 27-0.8 MG Tabs tablet Take 1 tablet by mouth daily at 12 noon.       Sharen Counter Certified Nurse-Midwife 08/07/2018 9:01 PM

## 2018-08-07 NOTE — MAU Note (Signed)
Pt reports she woke up this morning 3am to go to BR . Felt like the room was spinning. Made it to the Br but felt nauseated went back t bed and then she needed to trow up s she crawled to BR vomited and went back to bed.  Got up a little later around 10 am and stil was very dizzy and nauseated , tried to eat and vomited it back up.  Still very dizzy and nauseated now.   Gets care in [place] but staying in [place] and her ride took her to MAU to be seen.

## 2018-08-09 LAB — CULTURE, OB URINE

## 2019-01-05 ENCOUNTER — Encounter (HOSPITAL_COMMUNITY): Payer: Self-pay

## 2019-01-05 ENCOUNTER — Ambulatory Visit (HOSPITAL_COMMUNITY)
Admission: EM | Admit: 2019-01-05 | Discharge: 2019-01-05 | Disposition: A | Discharge status: Home / Self Care | Payer: BLUE CROSS/BLUE SHIELD | Attending: Family Medicine | Admitting: Family Medicine

## 2019-01-05 DIAGNOSIS — B349 Viral infection, unspecified: Secondary | ICD-10-CM | POA: Diagnosis not present

## 2019-01-05 MED ORDER — IBUPROFEN 800 MG PO TABS: 800.0000 mg | ORAL_TABLET | Freq: Three times a day (TID) | ORAL | 0 refills | Status: AC

## 2019-01-05 MED ORDER — KETOROLAC TROMETHAMINE 60 MG/2ML IM SOLN
60.0000 mg | Freq: Once | INTRAMUSCULAR | Status: AC
  Administered 2019-01-05: 60 mg via INTRAMUSCULAR

## 2019-01-05 MED ORDER — KETOROLAC TROMETHAMINE 60 MG/2ML IM SOLN
INTRAMUSCULAR | Status: AC
  Filled 2019-01-05: qty 2

## 2019-01-05 MED ORDER — ONDANSETRON 4 MG PO TBDP
ORAL_TABLET | ORAL | Status: AC
  Filled 2019-01-05: qty 1

## 2019-01-05 MED ORDER — ONDANSETRON 4 MG PO TBDP
4.0000 mg | ORAL_TABLET | Freq: Once | ORAL | Status: AC
  Administered 2019-01-05: 4 mg via ORAL

## 2019-01-05 MED ORDER — ONDANSETRON HCL 4 MG PO TABS: 4.0000 mg | ORAL_TABLET | Freq: Four times a day (QID) | ORAL | 0 refills | Status: AC

## 2019-01-05 NOTE — ED Triage Notes (Signed)
Pt presents with ongoing headache and nausea. 

## 2019-01-05 NOTE — Discharge Instructions (Signed)
Ibuprofen 3 times a day with food Take Zofran as needed for nausea and vomiting Rest and push fluids Return if not improving in a few days

## 2019-01-05 NOTE — ED Provider Notes (Addendum)
[facility]    CSN: [id number] Arrival date & time: 01/05/19  5465     History   Chief Complaint Chief Complaint  Patient presents with  . Headache  . Nausea    HPI Selena Burch is a 28 y.o. female.   HPI  Patient is here for headache.  Nausea.  Fever.  Body aches.  She has some photophobia.  Has a history of migraines remotely but not for a while.  Thinks this migraine may have been triggered from a viral illness.  She has 3 children at home age 22 months 3 years and 4 years.  None of the children are sick.  They are currently being cared for by her mother.  He has been trying to rest and drink fluids.  Has not taken any medicines.  No vomiting.  No diarrhea.  He has had some sweats and chills but did not take her temperature.  Past Medical History:  Diagnosis Date  . Preterm labor     There are no active problems to display for this patient.   Past Surgical History:  Procedure Laterality Date  . DILATION AND CURETTAGE OF UTERUS      OB History    Gravida  4   Para  2   Term      Preterm  2   AB  1   Living  2     SAB      TAB  1   Ectopic      Multiple      Live Births  2            Home Medications    Prior to Admission medications   Medication Sig Start Date End Date Taking? Authorizing Provider  docusate sodium (COLACE) 100 MG capsule Take 1 capsule (100 mg total) by mouth 2 (two) times daily as needed. 08/07/18   Leftwich-Kirby, Wilmer Floor, CNM  ferrous sulfate (FERROUSUL) 325 (65 FE) MG tablet Take 1 tablet (325 mg total) by mouth daily with breakfast. 08/07/18   Leftwich-Kirby, Wilmer Floor, CNM  ibuprofen (ADVIL,MOTRIN) 800 MG tablet Take 1 tablet (800 mg total) by mouth 3 (three) times daily. 01/05/19   Eustace Moore, MD  loratadine (CLARITIN) 10 MG tablet Take 10 mg by mouth daily as needed for allergies.    [provider]  metoCLOPramide (REGLAN) 10 MG tablet Take 1 tablet (10 mg total) by mouth 3 (three) times  daily before meals. 08/07/18   Leftwich-Kirby, Wilmer Floor, CNM  ondansetron (ZOFRAN) 4 MG tablet Take 1 tablet (4 mg total) by mouth every 6 (six) hours. 01/05/19   Eustace Moore, MD  Prenatal Vit-Fe Fumarate-FA (MULTIVITAMIN-PRENATAL) 27-0.8 MG TABS tablet Take 1 tablet by mouth daily at 12 noon.    [provider]    Family History History reviewed. No pertinent family history.  Social History Social History   Tobacco Use  . Smoking status: Never Smoker  . Smokeless tobacco: Never Used  Substance Use Topics  . Alcohol use: No  . Drug use: No     Allergies   Hydroxyprogesterone caproate   Review of Systems Review of Systems  Constitutional: Positive for fatigue and fever. Negative for chills.  HENT: Positive for congestion, rhinorrhea and sore throat. Negative for ear pain.   Eyes: Positive for photophobia. Negative for pain and visual disturbance.  Respiratory: Negative for cough and shortness of breath.   Cardiovascular: Negative for chest pain and palpitations.  Gastrointestinal: Positive  for nausea. Negative for abdominal pain and vomiting.  Genitourinary: Negative for dysuria and hematuria.  Musculoskeletal: Positive for myalgias. Negative for arthralgias and back pain.  Skin: Negative for color change and rash.  Neurological: Positive for headaches. Negative for seizures and syncope.  All other systems reviewed and are negative.    Physical Exam Triage Vital Signs ED Triage Vitals  Enc Vitals Group     BP 01/05/19 1030 110/75     Pulse Rate 01/05/19 0956 85     Resp 01/05/19 0956 16     Temp 01/05/19 0956 98.6 F (37 C)     Temp Source 01/05/19 0956 Oral     SpO2 01/05/19 0956 98 %     Weight --      Height --      Head Circumference --      Peak Flow --      Pain Score 01/05/19 0956 9     Pain Loc --      Pain Edu? --      Excl. in GC? --    No data found.  Updated Vital Signs BP 110/75 (BP Location: Left Arm)   Pulse 85   Temp 98.6 F  (37 C) (Oral)   Resp 16   SpO2 98%   Breastfeeding Unknown Comment: irregular spotting     Physical Exam Constitutional:      General: She is not in acute distress.    Appearance: She is well-developed. She is obese.  HENT:     Head: Normocephalic and atraumatic.     Mouth/Throat:     Comments: Geographic tongue.  Slightly dry mucous membranes Eyes:     General: No visual field deficit.    Extraocular Movements: Extraocular movements intact.     Right eye: Normal extraocular motion and no nystagmus.     Left eye: Normal extraocular motion and no nystagmus.     Conjunctiva/sclera: Conjunctivae normal.     Pupils: Pupils are equal, round, and reactive to light.  Neck:     Musculoskeletal: Normal range of motion.  Cardiovascular:     Rate and Rhythm: Normal rate and regular rhythm.  Pulmonary:     Effort: Pulmonary effort is normal. No respiratory distress.     Breath sounds: Normal breath sounds.  Abdominal:     General: Bowel sounds are normal. There is no distension.     Palpations: Abdomen is soft.  Musculoskeletal: Normal range of motion.  Lymphadenopathy:     Cervical: Cervical adenopathy present.  Skin:    General: Skin is warm and dry.  Neurological:     Mental Status: She is alert. Mental status is at baseline.     Cranial Nerves: No cranial nerve deficit, dysarthria or facial asymmetry.     Motor: No weakness.     Gait: Gait normal.     Deep Tendon Reflexes: Reflexes normal.  Psychiatric:     Comments: Appears sick/tired      UC Treatments / Results  Labs (all labs ordered are listed, but only abnormal results are displayed) Labs Reviewed - No data to display  EKG None  Radiology No results found.  Procedures Procedures (including critical care time)  Medications Ordered in UC Medications  ondansetron (ZOFRAN-ODT) disintegrating tablet 4 mg (4 mg Oral Given 01/05/19 1002)  ketorolac (TORADOL) injection 60 mg (60 mg Intramuscular Given 01/05/19  1053)    Initial Impression / Assessment and Plan / UC Course  I have reviewed the triage  vital signs and the nursing notes.  Pertinent labs & imaging results that were available during my care of the patient were reviewed by me and considered in my medical decision making (see chart for details).    Patient likely has a viral illness with fever and body aches.  Possible influenza.  No known influenza exposure.  With headache and nausea, and a history of migraines I feel it is triggered a migraine headache.  No evidence of neck stiffness, meningitis, or serious infection noted.  Patient eager to go home.  Final Clinical Impressions(s) / UC Diagnoses   Final diagnoses:  Viral illness     Discharge Instructions     Ibuprofen 3 times a day with food Take Zofran as needed for nausea and vomiting Rest and push fluids Return if not improving in a few days    ED Prescriptions    Medication Sig Dispense Auth. Provider   ondansetron (ZOFRAN) 4 MG tablet Take 1 tablet (4 mg total) by mouth every 6 (six) hours. 12 tablet Eustace Moore, MD   ibuprofen (ADVIL,MOTRIN) 800 MG tablet Take 1 tablet (800 mg total) by mouth 3 (three) times daily. 21 tablet Eustace Moore, MD     Controlled Substance Prescriptions [place] Controlled Substance Registry consulted? Not Applicable   Eustace Moore, MD 01/05/19 1057    Eustace Moore, MD 01/05/19 [phone]

## 2020-03-31 IMAGING — US US MFM OB TRANSVAGINAL
1 series · 15 of 20 positions shown · non-contrast
Comparison: none

[Series 1: us mfm ob transvaginal · 20 acquisitions, 15 frames shown]
[im 1/20]
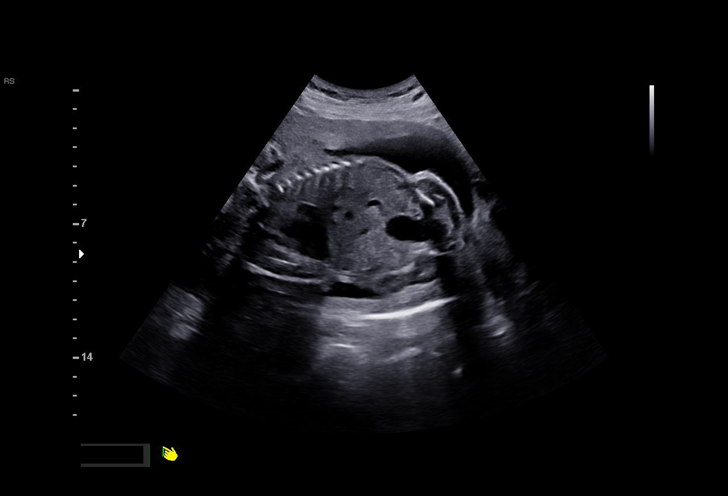
[im 3/20]
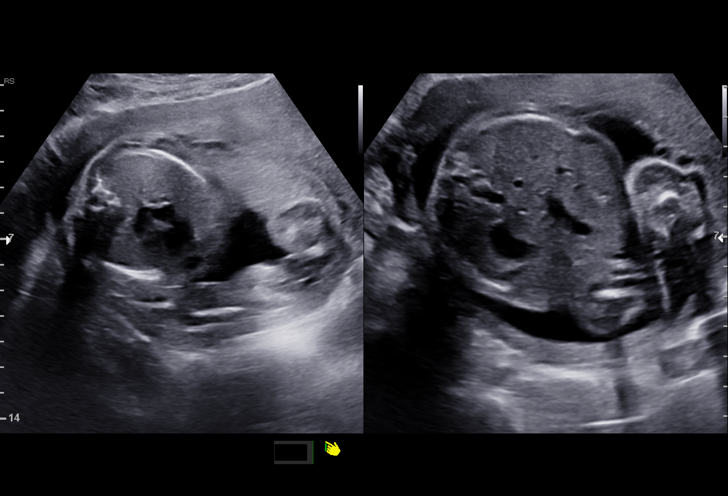
[im 4/20]
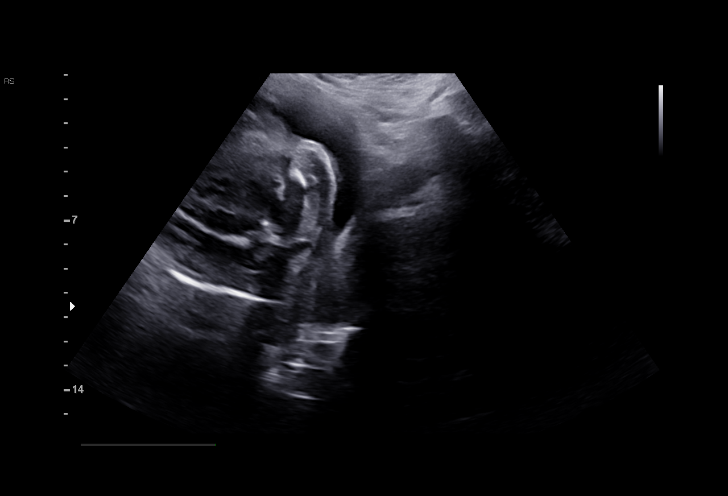
[im 5/20]
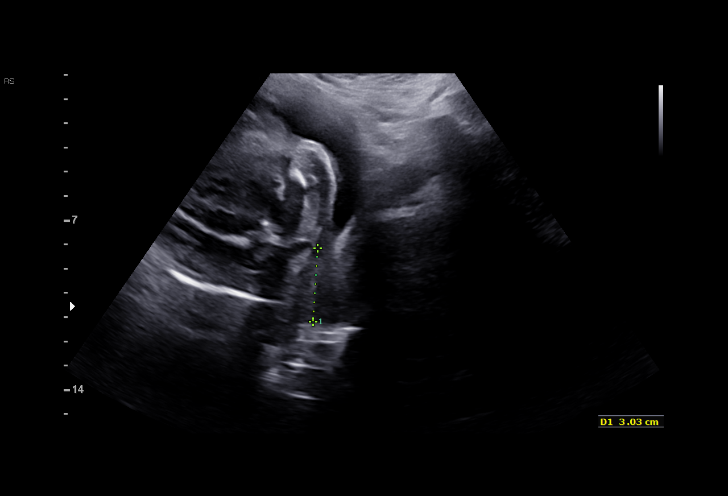
[im 7/20]
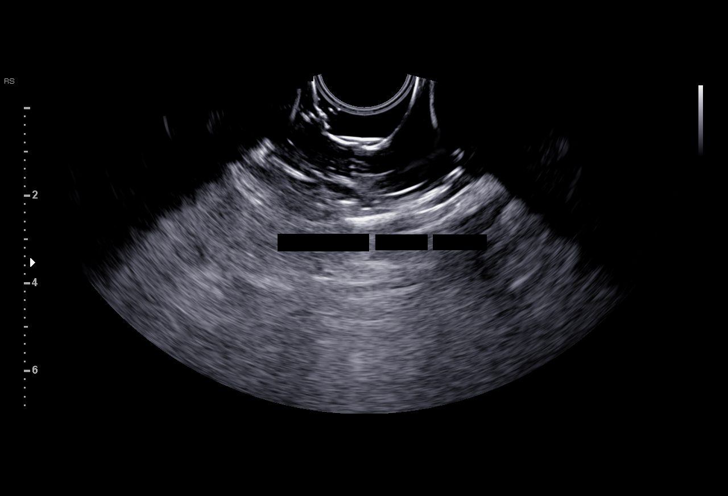
[im 8/20]
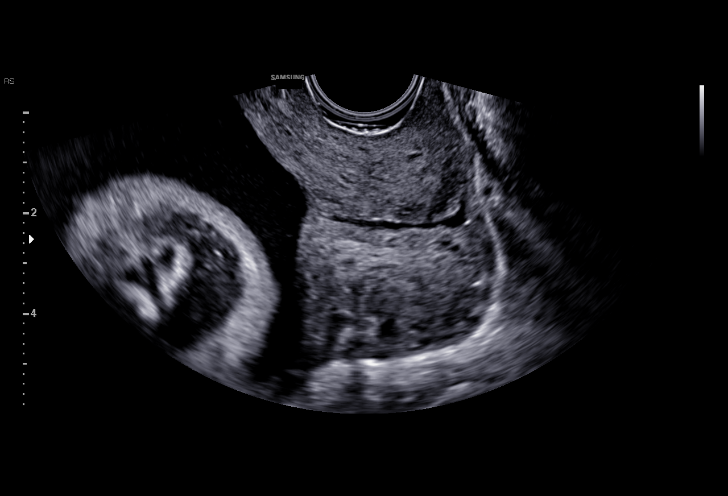
[im 9/20]
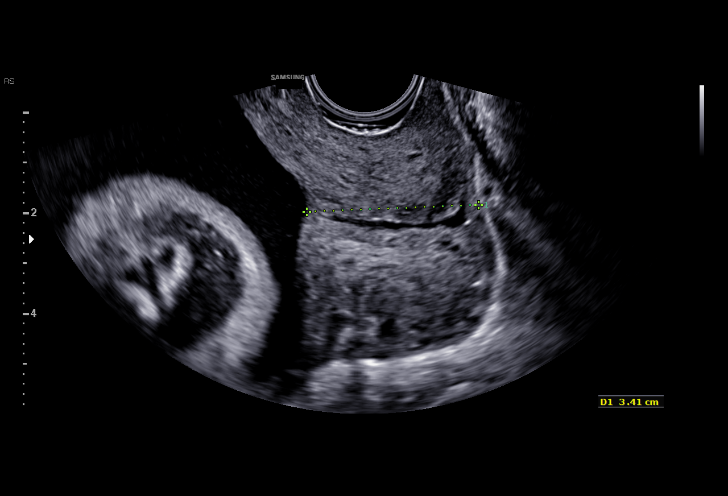
[im 11/20]
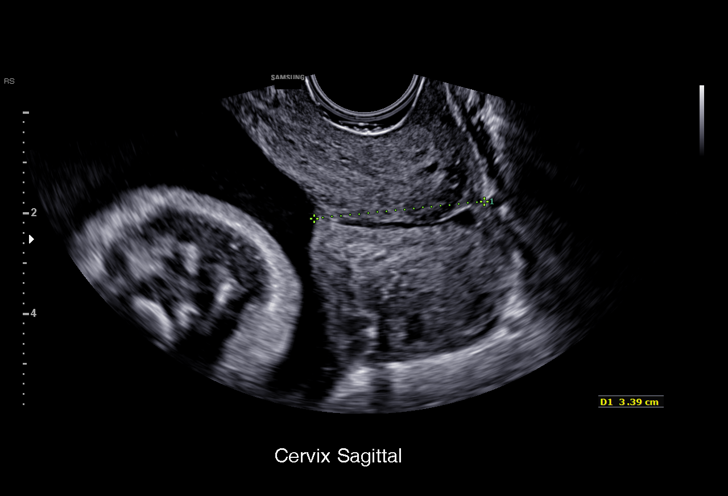
[im 12/20]
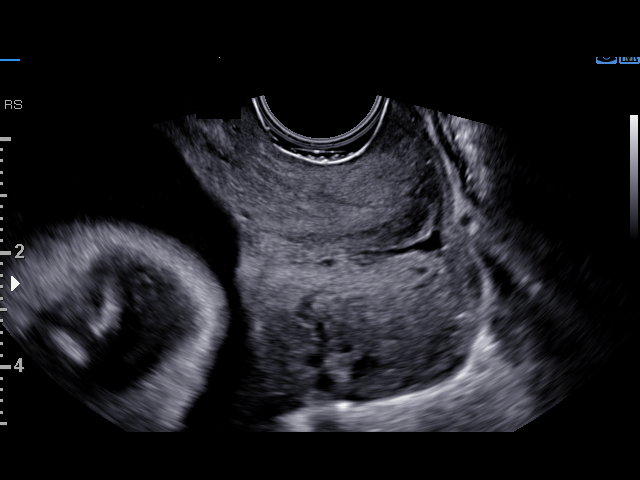
[im 13/20]
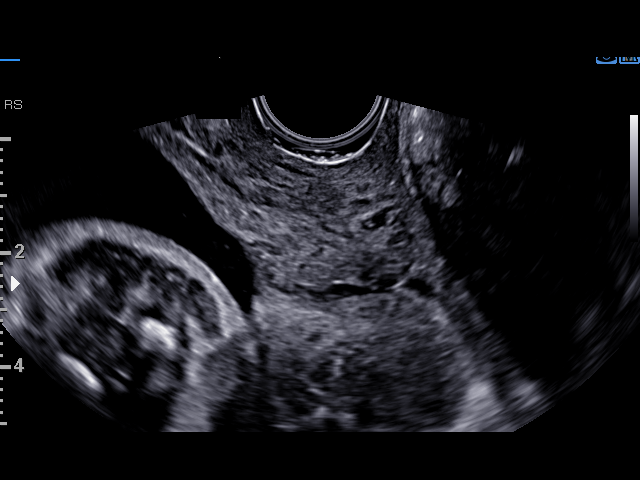
[im 15/20]
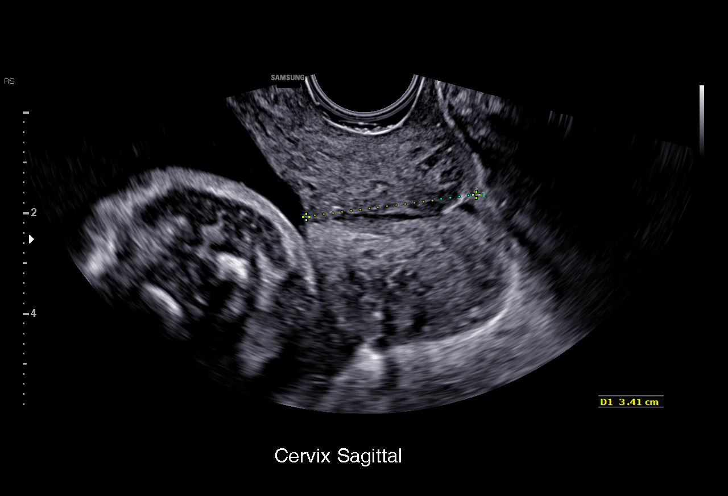
[im 16/20]
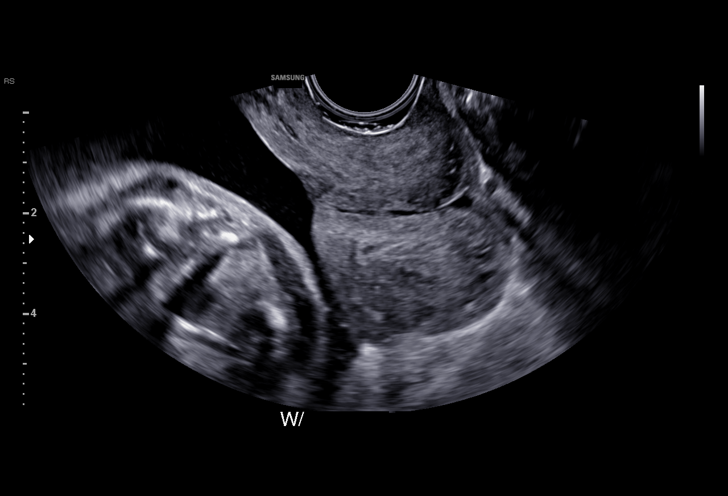
[im 17/20]
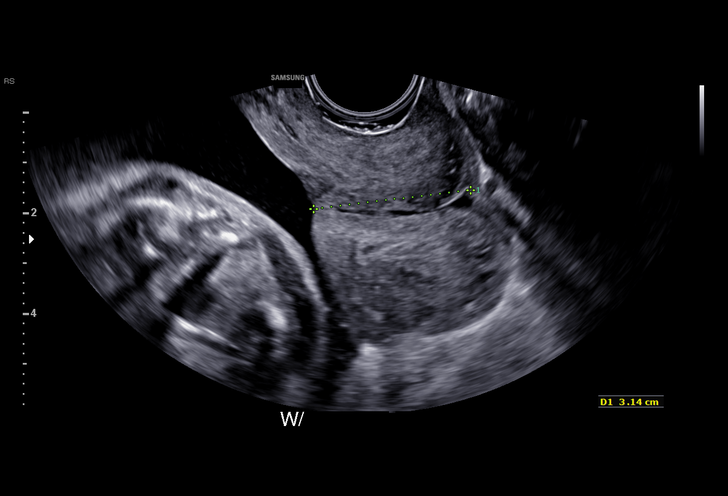
[im 19/20]
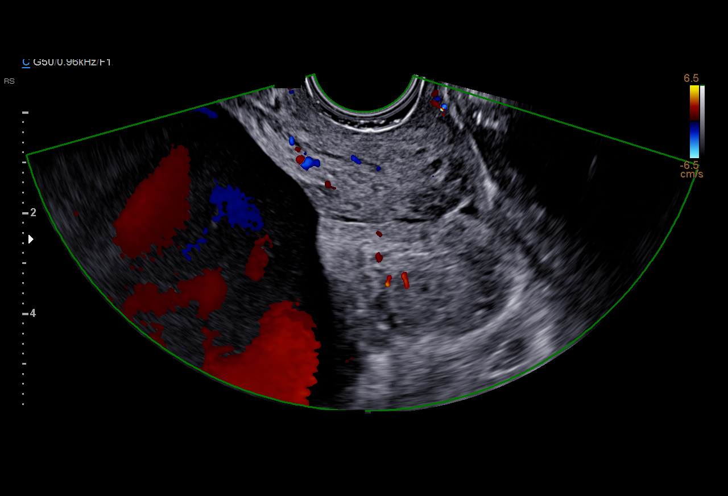
[im 20/20]
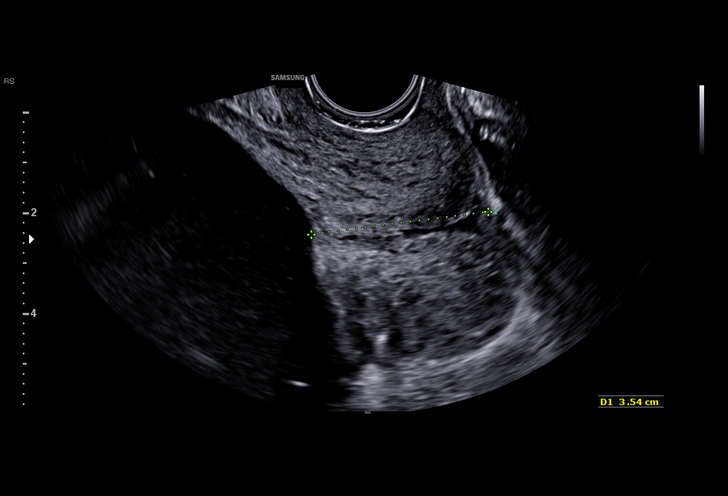

[15 of 20 positions shown; findings below may reference images not displayed]

Attending:        Ervista Ceola        Secondary Phy.:   [facility]-
MAU/Triage

Indications

Encounter for cervical length
Abdominal pain in pregnancy
Poor obstetric history: Previous preterm
delivery, antepartum (@35 and 27wks)
25 weeks gestation of pregnancy
Fetal Evaluation

Num Of Fetuses:         1
Fetal Heart Rate(bpm):  148
Cardiac Activity:       Observed
Presentation:           Breech

Amniotic Fluid
AFI FV:      Within normal limits
OB History

Gravidity:    4         Prem:   2
TOP:          1        Living:  2
Gestational Age

Best:          25w 2d     Det. By:  Previous Ultrasound      EDD:   10/18/18
Cervix Uterus Adnexa

Cervix
Length:           3.41  cm.
Measured transvaginally.
Impression

Patient was evaluated for abdominal pain and vaginal
spotting.
Amniotic fluid is normal and good fetal activity is seen. On
transvaginal scan, the cervix measures 3.4 cm, which is
normal.
